# Patient Record
Sex: Female | Born: 1935 | Race: White | Hispanic: No | Marital: Married | Smoking: Former smoker
Health system: Southern US, Community
[De-identification: ages and names within clinical notes are randomized; demographics above are authoritative.]

## PROBLEM LIST (undated history)

## (undated) DIAGNOSIS — K7460 Unspecified cirrhosis of liver: Secondary | ICD-10-CM

## (undated) DIAGNOSIS — I10 Essential (primary) hypertension: Secondary | ICD-10-CM

## (undated) DIAGNOSIS — G459 Transient cerebral ischemic attack, unspecified: Secondary | ICD-10-CM

## (undated) DIAGNOSIS — E119 Type 2 diabetes mellitus without complications: Secondary | ICD-10-CM

## (undated) DIAGNOSIS — K5792 Diverticulitis of intestine, part unspecified, without perforation or abscess without bleeding: Secondary | ICD-10-CM

## (undated) DIAGNOSIS — F0390 Unspecified dementia without behavioral disturbance: Secondary | ICD-10-CM

## (undated) HISTORY — PX: FEMUR SURGERY: SHX943

---

## 1997-12-13 ENCOUNTER — Other Ambulatory Visit: Admission: RE | Admit: 1997-12-13 | Discharge: 1997-12-13 | Payer: Self-pay | Admitting: Gynecology

## 1998-01-19 ENCOUNTER — Ambulatory Visit (HOSPITAL_COMMUNITY): Admission: RE | Admit: 1998-01-19 | Discharge: 1998-01-19 | Payer: Self-pay | Admitting: Gynecology

## 1999-12-01 ENCOUNTER — Emergency Department (HOSPITAL_COMMUNITY): Admission: EM | Admit: 1999-12-01 | Discharge: 1999-12-01 | Payer: Self-pay | Admitting: Emergency Medicine

## 1999-12-01 ENCOUNTER — Encounter: Payer: Self-pay | Admitting: Emergency Medicine

## 2016-07-14 ENCOUNTER — Emergency Department (HOSPITAL_COMMUNITY)
Admission: EM | Admit: 2016-07-14 | Discharge: 2016-07-15 | Disposition: A | Discharge status: Home / Self Care | Payer: Medicare HMO | Attending: Emergency Medicine | Admitting: Emergency Medicine

## 2016-07-14 ENCOUNTER — Encounter (HOSPITAL_COMMUNITY): Payer: Self-pay | Admitting: Emergency Medicine

## 2016-07-14 ENCOUNTER — Emergency Department (HOSPITAL_COMMUNITY): Payer: Medicare HMO

## 2016-07-14 DIAGNOSIS — E119 Type 2 diabetes mellitus without complications: Secondary | ICD-10-CM | POA: Diagnosis not present

## 2016-07-14 DIAGNOSIS — N939 Abnormal uterine and vaginal bleeding, unspecified: Secondary | ICD-10-CM | POA: Insufficient documentation

## 2016-07-14 DIAGNOSIS — Z87891 Personal history of nicotine dependence: Secondary | ICD-10-CM | POA: Diagnosis not present

## 2016-07-14 DIAGNOSIS — N390 Urinary tract infection, site not specified: Secondary | ICD-10-CM | POA: Diagnosis not present

## 2016-07-14 DIAGNOSIS — Z7982 Long term (current) use of aspirin: Secondary | ICD-10-CM | POA: Insufficient documentation

## 2016-07-14 DIAGNOSIS — Z7984 Long term (current) use of oral hypoglycemic drugs: Secondary | ICD-10-CM | POA: Diagnosis not present

## 2016-07-14 DIAGNOSIS — G309 Alzheimer's disease, unspecified: Secondary | ICD-10-CM | POA: Insufficient documentation

## 2016-07-14 DIAGNOSIS — I10 Essential (primary) hypertension: Secondary | ICD-10-CM | POA: Insufficient documentation

## 2016-07-14 DIAGNOSIS — R109 Unspecified abdominal pain: Secondary | ICD-10-CM | POA: Diagnosis present

## 2016-07-14 HISTORY — DX: Essential (primary) hypertension: I10

## 2016-07-14 HISTORY — DX: Unspecified cirrhosis of liver: K74.60

## 2016-07-14 HISTORY — DX: Transient cerebral ischemic attack, unspecified: G45.9

## 2016-07-14 HISTORY — DX: Diverticulitis of intestine, part unspecified, without perforation or abscess without bleeding: K57.92

## 2016-07-14 HISTORY — DX: Unspecified dementia, unspecified severity, without behavioral disturbance, psychotic disturbance, mood disturbance, and anxiety: F03.90

## 2016-07-14 HISTORY — DX: Type 2 diabetes mellitus without complications: E11.9

## 2016-07-14 LAB — COMPREHENSIVE METABOLIC PANEL
ALT: 14 U/L (ref 14–54)
AST: 21 U/L (ref 15–41)
Albumin: 3.5 g/dL (ref 3.5–5.0)
Alkaline Phosphatase: 111 U/L (ref 38–126)
Anion gap: 8 (ref 5–15)
BUN: 14 mg/dL (ref 6–20)
CO2: 23 mmol/L (ref 22–32)
Calcium: 10.2 mg/dL (ref 8.9–10.3)
Chloride: 102 mmol/L (ref 101–111)
Creatinine, Ser: 0.8 mg/dL (ref 0.44–1.00)
GFR calc Af Amer: >60 mL/min (ref >60)
GFR calc non Af Amer: >60 mL/min (ref >60)
Glucose, Bld: 130 mg/dL — ABNORMAL HIGH (ref 65–99)
Potassium: 4 mmol/L (ref 3.5–5.1)
Sodium: 133 mmol/L — ABNORMAL LOW (ref 135–145)
Total Bilirubin: 0.6 mg/dL (ref 0.3–1.2)
Total Protein: 7.1 g/dL (ref 6.5–8.1)

## 2016-07-14 LAB — CBC WITH DIFFERENTIAL/PLATELET
Basophils Absolute: 0 10*3/uL (ref 0.0–0.1)
Basophils Relative: 0 %
Eosinophils Absolute: 0.1 10*3/uL (ref 0.0–0.7)
Eosinophils Relative: 2 %
HCT: 37.1 % (ref 36.0–46.0)
Hemoglobin: 12.2 g/dL (ref 12.0–15.0)
Lymphocytes Relative: 28 %
Lymphs Abs: 1.9 10*3/uL (ref 0.7–4.0)
MCH: 33.2 pg (ref 26.0–34.0)
MCHC: 32.9 g/dL (ref 30.0–36.0)
MCV: 101.1 fL — ABNORMAL HIGH (ref 78.0–100.0)
Monocytes Absolute: 0.7 10*3/uL (ref 0.1–1.0)
Monocytes Relative: 9 %
Neutro Abs: 4.3 10*3/uL (ref 1.7–7.7)
Neutrophils Relative %: 61 %
Platelets: 97 10*3/uL — ABNORMAL LOW (ref 150–400)
RBC: 3.67 MIL/uL — ABNORMAL LOW (ref 3.87–5.11)
RDW: 13.3 % (ref 11.5–15.5)
WBC: 7.1 10*3/uL (ref 4.0–10.5)

## 2016-07-14 LAB — URINALYSIS, ROUTINE W REFLEX MICROSCOPIC
BILIRUBIN URINE: NEGATIVE
Glucose, UA: NEGATIVE mg/dL
HGB URINE DIPSTICK: NEGATIVE
KETONES UR: NEGATIVE mg/dL
NITRITE: POSITIVE — AB
PROTEIN: NEGATIVE mg/dL
Specific Gravity, Urine: 1.018 (ref 1.005–1.030)
pH: 5 (ref 5.0–8.0)

## 2016-07-14 LAB — PROTIME-INR
INR: 1.02
Prothrombin Time: 13.4 seconds (ref 11.4–15.2)

## 2016-07-14 NOTE — ED Notes (Signed)
Pt with vaginal bleeding small amount and has passed a clot per pt's daughter (pt has dementia).

## 2016-07-14 NOTE — ED Notes (Signed)
Unable to obtain blood when IV was started. Lab called and advised they will need to redraw blood work.

## 2016-07-14 NOTE — ED Triage Notes (Addendum)
Patient c/o vaginal bleeding that started today. Denies any pain. Patient states bright red blood with clots. Patient has had filled x1 depends. Denies any bleeding at this time. Patient does take plavix and aspirin.

## 2016-07-14 NOTE — ED Notes (Signed)
Notified by lab that blood work hemolyzed and needs to be drawn by nurse when obtaining IV.

## 2016-07-14 NOTE — ED Provider Notes (Signed)
[facility] Provider Note   CSN: [id number] Arrival date & time: 07/14/16  1536     History   Chief Complaint Chief Complaint  Patient presents with  . Vaginal Bleeding    HPI Anita Walters is a 83 y.o. female.  Patient is an 83 year old female who presents to the emergency department with a complaint of vaginal bleeding.  The patient suffers from Alzheimer's. The patient's daughter is assisting with the history.  The patient and the patient's daughter states that the vaginal bleeding started today. The patient noted some mild bright red blood in her underwear. She later noted blood and clots in her underwear. It is of note that the patient takes Plavix and aspirin. His been no injury or trauma reported. The patient has not had fever or chills to be reported. The patient's daughter states that the patient goes to the bathroom quite frequently, but she is unsure if this is related to the patient's dementia, or even her diabetes. No blood or other symptoms on have been noted prior to today's episode. His been no report of falls or loss of consciousness. The patient denies any unusual weakness.   The history is provided by the patient and a relative.  Vaginal Bleeding  Primary symptoms include dysuria, and vaginal bleeding. Associated symptoms include frequency. Pertinent negatives include no abdominal pain, no diarrhea, no nausea and no vomiting.    Past Medical History:  Diagnosis Date  . Dementia   . Diabetes mellitus without complication (HCC)   . Diverticulitis   . Hypertension   . Liver cirrhosis (HCC)   . TIA (transient ischemic attack)     There are no active problems to display for this patient.   Past Surgical History:  Procedure Laterality Date  . FEMUR SURGERY Left     OB History    Gravida Para Term Preterm AB Living   6 6 5 1   6    SAB TAB Ectopic Multiple Live Births                   Home Medications    Prior to Admission medications     Medication Sig Start Date End Date Taking? Authorizing Provider  aspirin EC 81 MG tablet Take 81 mg by mouth daily.   Yes Historical Provider, MD  carvedilol (COREG) 6.25 MG tablet Take 6.25 mg by mouth 2 (two) times daily. 07/06/16  Yes Historical Provider, MD  clopidogrel (PLAVIX) 75 MG tablet Take 75 mg by mouth daily. 05/10/16  Yes Historical Provider, MD  escitalopram (LEXAPRO) 10 MG tablet Take 10 mg by mouth daily. 07/01/16  Yes Historical Provider, MD  ferrous sulfate 325 (65 FE) MG tablet Take 325 mg by mouth daily with breakfast.   Yes Historical Provider, MD  levothyroxine (SYNTHROID, LEVOTHROID) 75 MCG tablet Take 75 mcg by mouth daily. 07/01/16  Yes Historical Provider, MD  LORazepam (ATIVAN) 0.5 MG tablet Take 0.5 mg by mouth 2 (two) times daily as needed for anxiety or sleep.  06/05/16  Yes Historical Provider, MD  metFORMIN (GLUCOPHAGE) 500 MG tablet Take 500 mg by mouth 2 (two) times daily. 07/06/16  Yes Historical Provider, MD  oxyCODONE (OXY IR/ROXICODONE) 5 MG immediate release tablet Take 5 mg by mouth 3 (three) times daily as needed for moderate pain or severe pain.  06/20/16  Yes Historical Provider, MD  spironolactone (ALDACTONE) 25 MG tablet Take 25 mg by mouth daily. 07/01/16  Yes Historical Provider, MD  traZODone (DESYREL) 50 MG  tablet Take 50 mg by mouth at bedtime.  06/05/16  Yes Historical Provider, MD    Family History Family History  Problem Relation Age of Onset  . Heart failure Mother   . Diabetes Other     Social History Social History  Substance Use Topics  . Smoking status: Former Smoker    Years: 20.00    Types: Cigarettes    Quit date: 05/27/1979  . Smokeless tobacco: Never Used  . Alcohol use No     Allergies   Morphine and related   Review of Systems Review of Systems  Constitutional: Positive for appetite change. Negative for activity change, chills and fever.  HENT: Negative for congestion, sore throat and trouble swallowing.   Eyes: Negative.    Respiratory: Negative for cough, choking, shortness of breath and wheezing.   Cardiovascular: Negative for chest pain and leg swelling.  Gastrointestinal: Negative for abdominal pain, anal bleeding, blood in stool, diarrhea, nausea, rectal pain and vomiting.  Genitourinary: Positive for dysuria, frequency and vaginal bleeding.  Musculoskeletal: Positive for arthralgias and back pain.  Skin: Negative for rash and wound.     Physical Exam Updated Vital Signs BP (!) 148/52 (BP Location: Left Arm)   Pulse (!) 58   Temp 98.2 F (36.8 C) (Oral)   Resp 20   Ht 5\' 7"  (1.702 m)   Wt 94.3 kg   SpO2 99%   BMI 32.58 kg/m   Physical Exam  Constitutional: She is oriented to person, place, and time. She appears well-developed and well-nourished.  Non-toxic appearance. No distress.  Daughter reports pt has bouts of dementia, and all responses may not be accurate.  HENT:  Head: Normocephalic.  Right Ear: Tympanic membrane and external ear normal.  Left Ear: Tympanic membrane and external ear normal.  Eyes: EOM and lids are normal. Pupils are equal, round, and reactive to light. No scleral icterus.  Neck: Normal range of motion. Neck supple. Carotid bruit is not present.  Cardiovascular: Normal rate, regular rhythm, normal heart sounds, intact distal pulses and normal pulses.  Exam reveals no gallop and no friction rub.   Pulmonary/Chest: Effort normal and breath sounds normal. No respiratory distress. She has no wheezes.  Abdominal: Soft. Bowel sounds are normal. There is no tenderness. There is no guarding.  Mild to moderate tenderness at the suprapubic area and the left lower quadrant.  Musculoskeletal: Normal range of motion.  Lymphadenopathy:       Head (right side): No submandibular adenopathy present.       Head (left side): No submandibular adenopathy present.    She has no cervical adenopathy.  Neurological: She is alert and oriented to person, place, and time. She has normal  strength. No cranial nerve deficit or sensory deficit.  Skin: Skin is warm and dry.  Psychiatric: She has a normal mood and affect. Her speech is normal.  Nursing note and vitals reviewed.    ED Treatments / Results  Labs (all labs ordered are listed, but only abnormal results are displayed) Labs Reviewed  CBC WITH DIFFERENTIAL/PLATELET  CBC WITH DIFFERENTIAL/PLATELET  COMPREHENSIVE METABOLIC PANEL  PROTIME-INR    EKG  EKG Interpretation None       Radiology No results found.  Procedures Procedures (including critical care time)  Medications Ordered in ED Medications - No data to display   Initial Impression / Assessment and Plan / ED Course  I have reviewed the triage vital signs and the nursing notes.  Pertinent labs & imaging  results that were available during my care of the patient were reviewed by me and considered in my medical decision making (see chart for details).     *I have reviewed nursing notes, vital signs, and all appropriate lab and imaging results for this patient.**  Final Clinical Impressions(s) / ED Diagnoses MDM Patient reports vaginal bleeding that started today. At one point she states she saw bright red blood and clots present. She's not had this problem in the past. There's been no recent operations or procedures. There's been no recent injury or trauma to the area. Initial vital signs were well within normal limits, with a pulse rate of 67 and blood pressure 122/55.  The initial blood draw hemolyzed. Lab requested a second blood draw. Attempts were made to do this when the IV was started, but this was unsuccessful. The lab has been called back to the room to draw the blood. I discussed with the family the delay in obtaining information because of the difficulty with lab draw.   Recheck. Patient's heart rate slightly lower than on admission at 58 bpm, no other changes appreciated. No further bleeding while being in the emergency  department.  Urinalysis shows a hazy yellow specimen positive for nitrates and leukocyte esterase. There are few bacteria also present. Urine will be sent to the lab for culture.  Complete blood count shows the white blood cells to be normal at 7100. Hemoglobin and hematocrit of both middle. Platelets are low at 97,000. Competence of metabolic panel shows the sodium to be slightly low at 133, glucose is slightly elevated at 1:30. Remainder of the competence of metabolic panel is within normal limits. CT scan of the abdomen shows extensive sigmoid diverticulosis with muscular atrophy being present. There is mild sigmoid stranding, which is felt to be related to a chronic inflammation and scarring. There is also noted a small loculated air-containing collection within the pelvis adjacent to the sigmoid colon. It is felt this is probably related to residual air from a prior abscess area. Is no evidence of bowel obstruction. The appendix is normal. There is some cirrhosis present with portal venous hypertension present. There is also noted a 3.2 cm infrarenal abdominal aortic aneurysm that appears stable from the previous CT scan.  I discussed with the patient's daughter and the patient the findings from lab as well as from CT scanning. I suspect that this bleeding is related to urinary tract infection. However the possibility of it being related to the fistulous tract between the sigmoid colon and the posterior lower uterus is also a possibility. I explained to the family that I was reassured that during the entire emergency department visit the vital signs remained stable as well as the hemoglobin and hematocrit also normal. The patient will be treated for urinary tract infection. Culture will be sent to the lab. Given strict instructions to return immediately if any changes, problems, or concerns. The patient's daughter is in agreement with this plan.    Final diagnoses:  None    New Prescriptions New  Prescriptions   No medications on file     Ivery Quale, PA-C 07/17/16 1913    Lavera Guise, MD 07/18/16 [phone]

## 2016-07-15 MED ORDER — ONDANSETRON HCL 4 MG PO TABS
4.0000 mg | ORAL_TABLET | Freq: Once | ORAL | Status: AC
  Administered 2016-07-15: 4 mg via ORAL
  Filled 2016-07-15: qty 1

## 2016-07-15 MED ORDER — CEPHALEXIN 500 MG PO CAPS
500.0000 mg | ORAL_CAPSULE | Freq: Once | ORAL | Status: AC
  Administered 2016-07-15: 500 mg via ORAL
  Filled 2016-07-15: qty 1

## 2016-07-15 MED ORDER — CEPHALEXIN 500 MG PO CAPS: 500.0000 mg | ORAL_CAPSULE | Freq: Four times a day (QID) | ORAL | 0 refills | Status: DC

## 2016-07-15 NOTE — ED Notes (Signed)
Pt alert & oriented x4. Patient given discharge instructions, paperwork & prescription(s). Patient verbalized understanding. Pt left department in wheelchair escorted by staff. Pt left w/ no further questions. 

## 2016-07-15 NOTE — Discharge Instructions (Signed)
Your vital signs are within normal limits. Your lab work is negative for acute findings on. The CT scan does not show any acute problem. When compared to previous CT scans, we see where you have had diverticulitis, and continue to have a condition called diverticulosis on. We also see where that you have an abscess in the past. There is a question as to whether or not you have a small fistula from your sigmoid colon to your uterus area. There is also question as to whether or not the bleeding you noted is vaginal bleeding after menopause. You also have a urinary tract infection which could also be contributing to this bleeding. Please use Keflex with breakfast, lunch, dinner, and at bedtime. Please see Dr. Margo AyeHall in 5-7 days for recheck of your urine and recheck of your bleeding. Please return to the emergency department if the bleeding continues or becomes more forceful.

## 2016-07-17 LAB — URINE CULTURE

## 2016-07-18 ENCOUNTER — Telehealth (HOSPITAL_BASED_OUTPATIENT_CLINIC_OR_DEPARTMENT_OTHER): Payer: Self-pay

## 2016-07-18 NOTE — Telephone Encounter (Signed)
Post ED Visit - Positive Culture Follow-up  Culture report reviewed by antimicrobial stewardship pharmacist:  [x]  [name], Pharm.D. []  [name], Pharm.D., BCPS []  [name], Pharm.D. []  [name], Pharm.D., BCPS []  [name], [address]Pharm[address]D., BCPS, AAHIVP []  [name], Pharm.D., BCPS, AAHIVP []  [name], [address]Pharm[address] []  [name], [place]D.  Positive urine culture, >/= 100,000 colonies -> E Coli Treated with Cephalexin, organism sensitive to the same and no further patient follow-up is required at this time.   Anita RightClark, Anita Walters 07/18/2016, 11:36 AM

## 2016-09-15 ENCOUNTER — Encounter: Payer: Self-pay | Admitting: Neurology

## 2016-11-14 ENCOUNTER — Ambulatory Visit: Payer: Medicare HMO | Admitting: Neurology

## 2017-06-25 ENCOUNTER — Ambulatory Visit: Payer: Medicare HMO | Admitting: Cardiology

## 2017-07-10 ENCOUNTER — Ambulatory Visit: Payer: Medicare HMO | Admitting: Cardiovascular Disease

## 2017-07-17 ENCOUNTER — Encounter: Payer: Self-pay | Admitting: *Deleted

## 2017-07-17 ENCOUNTER — Ambulatory Visit: Payer: Medicare HMO | Admitting: Cardiology

## 2017-07-17 ENCOUNTER — Encounter: Payer: Self-pay | Admitting: Cardiology

## 2017-07-17 VITALS — BP 118/74 | HR 76 | Ht 66.0 in | Wt 218.0 lb

## 2017-07-17 DIAGNOSIS — I10 Essential (primary) hypertension: Secondary | ICD-10-CM | POA: Insufficient documentation

## 2017-07-17 DIAGNOSIS — R0789 Other chest pain: Secondary | ICD-10-CM | POA: Diagnosis not present

## 2017-07-17 NOTE — Progress Notes (Signed)
Clinical Summary Ms. Anita Walters is a 84 y.o.female seen as new consult, referred by Dr Margo AyeHall for chest pain.   1. Chest pain - episode 2 months ago. Patient felt tightness, SOB. Fairly frequent. Symptoms would last about 10 minutes. - wheelchair bound. Significant dementia. Collecting history is difficult - she denies any chest pain symptoms over the last few weeks     Past Medical History:  Diagnosis Date  . Dementia   . Diabetes mellitus without complication (HCC)   . Diverticulitis   . Hypertension   . Liver cirrhosis (HCC)   . TIA (transient ischemic attack)      Allergies  Allergen Reactions  . Morphine And Related Anaphylaxis     Current Outpatient Medications  Medication Sig Dispense Refill  . aspirin EC 81 MG tablet Take 81 mg by mouth daily.    . carvedilol (COREG) 6.25 MG tablet Take 6.25 mg by mouth 2 (two) times daily.    . cephALEXin (KEFLEX) 500 MG capsule Take 1 capsule (500 mg total) by mouth 4 (four) times daily. 20 capsule 0  . clopidogrel (PLAVIX) 75 MG tablet Take 75 mg by mouth daily.    Marland Kitchen. escitalopram (LEXAPRO) 10 MG tablet Take 10 mg by mouth daily.    . ferrous sulfate 325 (65 FE) MG tablet Take 325 mg by mouth daily with breakfast.    . levothyroxine (SYNTHROID, LEVOTHROID) 75 MCG tablet Take 75 mcg by mouth daily.    Marland Kitchen. LORazepam (ATIVAN) 0.5 MG tablet Take 0.5 mg by mouth 2 (two) times daily as needed for anxiety or sleep.     . metFORMIN (GLUCOPHAGE) 500 MG tablet Take 500 mg by mouth 2 (two) times daily.    Marland Kitchen. oxyCODONE (OXY IR/ROXICODONE) 5 MG immediate release tablet Take 5 mg by mouth 3 (three) times daily as needed for moderate pain or severe pain.     Marland Kitchen. spironolactone (ALDACTONE) 25 MG tablet Take 25 mg by mouth daily.    . traZODone (DESYREL) 50 MG tablet Take 50 mg by mouth at bedtime.      No current facility-administered medications for this visit.      Past Surgical History:  Procedure Laterality Date  . FEMUR SURGERY Left       Allergies  Allergen Reactions  . Morphine And Related Anaphylaxis      Family History  Problem Relation Age of Onset  . Heart failure Mother   . Diabetes Other      Social History Ms. Early reports that she quit smoking about 38 years ago. Her smoking use included cigarettes. She quit after 20.00 years of use. she has never used smokeless tobacco. Ms. Anita Walters reports that she does not drink alcohol.   Review of Systems CONSTITUTIONAL: No weight loss, fever, chills, weakness or fatigue.  HEENT: Eyes: No visual loss, blurred vision, double vision or yellow sclerae.No hearing loss, sneezing, congestion, runny nose or sore throat.  SKIN: No rash or itching.  CARDIOVASCULAR: per hpi RESPIRATORY: No shortness of breath, cough or sputum.  GASTROINTESTINAL: No anorexia, nausea, vomiting or diarrhea. No abdominal pain or blood.  GENITOURINARY: No burning on urination, no polyuria NEUROLOGICAL: No headache, dizziness, syncope, paralysis, ataxia, numbness or tingling in the extremities. No change in bowel or bladder control.  MUSCULOSKELETAL: No muscle, back pain, joint pain or stiffness.  LYMPHATICS: No enlarged nodes. No history of splenectomy.  PSYCHIATRIC: No history of depression or anxiety.  ENDOCRINOLOGIC: No reports of sweating, cold or heat  intolerance. No polyuria or polydipsia.  Marland Kitchen   Physical Examination Vitals:   07/17/17 0853 07/17/17 0858  BP: 118/80 118/74  Pulse: 79 76  SpO2: 94% 96%   Vitals:   07/17/17 0853  Weight: 218 lb (98.9 kg)  Height: 5\' 6"  (1.676 m)    Gen: resting comfortably, no acute distress HEENT: no scleral icterus, pupils equal round and reactive, no palptable cervical adenopathy,  CV: RRR, no m/r/g, no jvd Resp: Clear to auscultation bilaterally GI: abdomen is soft, non-tender, non-distended, normal bowel sounds, no hepatosplenomegaly MSK: extremities are warm, no edema.  Skin: warm, no rash Neuro:  no focal deficits Psych:  appropriate affect     Assessment and Plan  1. Chest pain - history is difficult to gather, patient with significant dementia - denies any symptoms over the last few weeks - discussed options as far as ischemic testing, and if stress testing was abnormal typically next step would be cath - due to her advanced dementia, poor functional capacity/wheel chair bound family is not in favor of ischemic testing at this time, I agree. Would continue to monitor, if recurrence could try course of antianginal therapy and follow symptoms.   F/u 6 months     Antoine Poche, M.D

## 2017-07-17 NOTE — Patient Instructions (Signed)
Your physician wants you to follow-up in:6 months  with Dr.Branch You will receive a reminder letter in the mail two months in advance. If you don't receive a letter, please call our office to schedule the follow-up appointment.   Your physician recommends that you continue on your current medications as directed. Please refer to the Current Medication list given to you today.    If you need a refill on your cardiac medications before your next appointment, please call your pharmacy.      No lab work or tests ordered today.       Thank you for choosing [facility] !         

## 2017-07-21 ENCOUNTER — Encounter: Payer: Self-pay | Admitting: Cardiology

## 2017-09-03 ENCOUNTER — Encounter: Payer: Self-pay | Admitting: Adult Health

## 2017-09-15 ENCOUNTER — Encounter: Payer: Self-pay | Admitting: Adult Health

## 2017-09-15 ENCOUNTER — Ambulatory Visit (INDEPENDENT_AMBULATORY_CARE_PROVIDER_SITE_OTHER): Payer: Medicare HMO | Admitting: Adult Health

## 2017-09-15 VITALS — BP 130/72 | HR 62

## 2017-09-15 DIAGNOSIS — N898 Other specified noninflammatory disorders of vagina: Secondary | ICD-10-CM | POA: Diagnosis not present

## 2017-09-15 DIAGNOSIS — N760 Acute vaginitis: Secondary | ICD-10-CM

## 2017-09-15 DIAGNOSIS — N950 Postmenopausal bleeding: Secondary | ICD-10-CM | POA: Diagnosis not present

## 2017-09-15 LAB — POCT WET PREP (WET MOUNT): WBC WET PREP: POSITIVE

## 2017-09-15 MED ORDER — METRONIDAZOLE 0.75 % VA GEL: 1.0000 | Freq: Two times a day (BID) | VAGINAL | 1 refills | Status: AC

## 2017-09-15 NOTE — Progress Notes (Signed)
Subjective:     Patient ID:  Anita Walters, female   DOB: 10/23/1933, 84 y.o.   MRN: [id number]  HPI Anita Walters is a 84 year old white female, married, PM, in complaining of vaginal spotting for a while, no pain.[facility]sp [facility], has diabetes and dementia, her husband and daughter are with her, but she arrived by rescue squad.  PCP is Dr Eden EmmsAriza.   Review of Systems Vaginal spotting for a while No pain Reviewed past medical,surgical, social and family history. Reviewed medications and allergies.     Objective:   Physical Exam BP 130/72 (BP Location: Left Arm, Patient Position: Sitting, Cuff Size: Normal)   Pulse 62  Skin warm and dry. Neck: mid line trachea, normal thyroid, good ROM, no lymphadenopathy noted. Lungs: clear to ausculation bilaterally. Cardiovascular: regular rate and rhythm. Pelvic: external genitalia is normal in appearance no lesions, vagina:copius tannish yellow vaginal discharge, slight odor,urethra has no lesions or masses noted, cervix is poorly visualized due pt inability to relax legs, uterus: normal size, shape and contour,mildly tender, no masses felt, adnexa: no masses,mild LLQ tenderness noted. Bladder is non tender and no masses felt. Wet prep: +++WBCs. PHQ 9 score 16 is on meds.      Assessment:     1. PMB (postmenopausal bleeding)   2. Vaginal discharge   3. Acute vaginitis       Plan:     Meds ordered this encounter  Medications  . metroNIDAZOLE (METROGEL) 0.75 % vaginal gel    Sig: Place 1 Applicatorful vaginally 2 (two) times daily.    Dispense:  70 g    Refill:  1    Order Specific Question:   Supervising Provider    Answer:   Duane LopeEURE, LUTHER H [[id number]]  Return in about a week for GYN US and see me afterwards Review handout on PMB

## 2017-09-15 NOTE — Patient Instructions (Signed)
Use metrogel bid in vagina Postmenopausal Bleeding Postmenopausal bleeding is any bleeding after menopause. Menopause is when a woman's period stops. Any type of bleeding after menopause is concerning. It should be checked by your doctor. Any treatment will depend on the cause. Follow these instructions at home: Watch your condition for any changes.  Avoid the use of tampons and douches as told by your doctor.  Change your pads often.  Get regular pelvic exams and Pap tests.  Keep all appointments for tests as told by your doctor.  Contact a doctor if:  Your bleeding lasts for more than 1 week.  You have belly (abdominal) pain.  You have bleeding after sex (intercourse). Get help right away if:  You have a fever, chills, a headache, dizziness, muscle aches, and bleeding.  You have strong pain with bleeding.  You have clumps of blood (blood clots) coming from your vagina.  You have bleeding and need more than 1 pad an hour.  You feel like you are going to pass out (faint). This information is not intended to replace advice given to you by your health care provider. Make sure you discuss any questions you have with your health care provider. Document Released: 02/19/2008 Document Revised: 10/18/2015 Document Reviewed: 12/09/2012 Elsevier Interactive Patient Education  2017 [employer]

## 2017-09-25 ENCOUNTER — Encounter: Payer: Self-pay | Admitting: Adult Health

## 2017-09-25 ENCOUNTER — Ambulatory Visit (INDEPENDENT_AMBULATORY_CARE_PROVIDER_SITE_OTHER): Payer: Medicare HMO

## 2017-09-25 ENCOUNTER — Ambulatory Visit (INDEPENDENT_AMBULATORY_CARE_PROVIDER_SITE_OTHER): Payer: Medicare HMO | Admitting: Adult Health

## 2017-09-25 VITALS — BP 140/90 | HR 92

## 2017-09-25 DIAGNOSIS — R9389 Abnormal findings on diagnostic imaging of other specified body structures: Secondary | ICD-10-CM | POA: Insufficient documentation

## 2017-09-25 DIAGNOSIS — N950 Postmenopausal bleeding: Secondary | ICD-10-CM | POA: Diagnosis not present

## 2017-09-25 NOTE — Progress Notes (Addendum)
PELVIC US TA/TV: heterogeneous anteverted uterus w/mult.myometrial calcification,homogeneous thickened endometrium 10.7 mm,unable to visualize ovaries,bilat adnexa's wnl,limited ultrasound,pt was unable to put legs in stirrups or empty bladder,she was scanned on stretcher.

## 2017-09-25 NOTE — Progress Notes (Signed)
  Subjective:     Patient ID:  Anita Walters, female   DOB: [birth year], 84 y.o.   MRN: [id number]  HPI Anita Walters is an 84 year old white female in for [place] to assess PMB.She resides at [facility] and is transported by ambulance.   Review of Systems PMB Recent discharge cleared per pt Reviewed past medical,surgical, social and family history. Reviewed medications and allergies.     Objective:   Physical Exam BP 140/90 (BP Location: Left Arm, Patient Position: Supine, Cuff Size: Large)   Pulse 92    US showed 10.7 mm endometrial strip and calcifications,was difficult exam.  Assessment:     1. Thickened endometrium       Plan:     Return in 2 weeks to see Dr Despina Hidden for endometrial biopsy,give extra time

## 2017-10-08 ENCOUNTER — Other Ambulatory Visit: Payer: Medicare HMO | Admitting: Obstetrics & Gynecology

## 2017-10-12 ENCOUNTER — Encounter (INDEPENDENT_AMBULATORY_CARE_PROVIDER_SITE_OTHER): Payer: Self-pay

## 2017-10-16 ENCOUNTER — Other Ambulatory Visit: Payer: Medicare HMO | Admitting: Obstetrics & Gynecology

## 2017-10-23 ENCOUNTER — Encounter: Payer: Self-pay | Admitting: Obstetrics & Gynecology

## 2017-10-23 ENCOUNTER — Ambulatory Visit (INDEPENDENT_AMBULATORY_CARE_PROVIDER_SITE_OTHER): Payer: Medicare HMO | Admitting: Obstetrics & Gynecology

## 2017-10-23 ENCOUNTER — Other Ambulatory Visit: Payer: Self-pay

## 2017-10-23 VITALS — BP 124/80 | HR 82 | Ht 66.0 in | Wt 220.0 lb

## 2017-10-23 DIAGNOSIS — N950 Postmenopausal bleeding: Secondary | ICD-10-CM

## 2017-10-23 DIAGNOSIS — R9389 Abnormal findings on diagnostic imaging of other specified body structures: Secondary | ICD-10-CM | POA: Diagnosis not present

## 2017-11-11 IMAGING — CT CT ABD-PELV W/O CM
2 of 4 series · 15 of 46 positions shown, 17 images · non-contrast
Comparison: CT of the abdomen pelvis dated 03/23/2016

CLINICAL DATA: 82-year-old female with vaginal bleeding and
abdominal pain.

EXAM:
CT ABDOMEN AND PELVIS WITHOUT CONTRAST
TECHNIQUE: Multidetector CT imaging of the abdomen and pelvis was performed
following the standard protocol without IV contrast.

[Series 2: axial st · axial · 0.88mm/px · z∈[+576,+1016]mm · 12 of 97 slices shown, 14 images]
[im 5/97  soft-tissue]
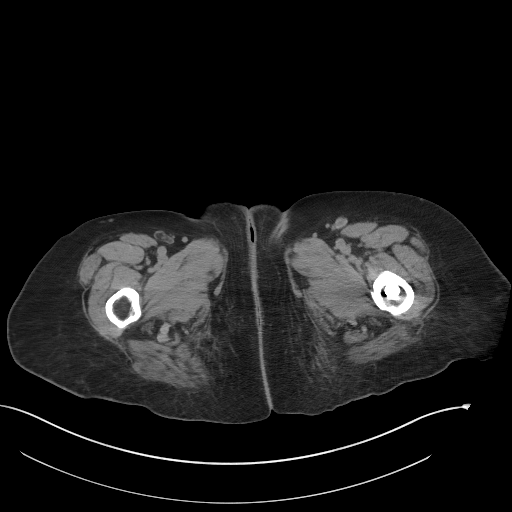
[im 5/97  bone]
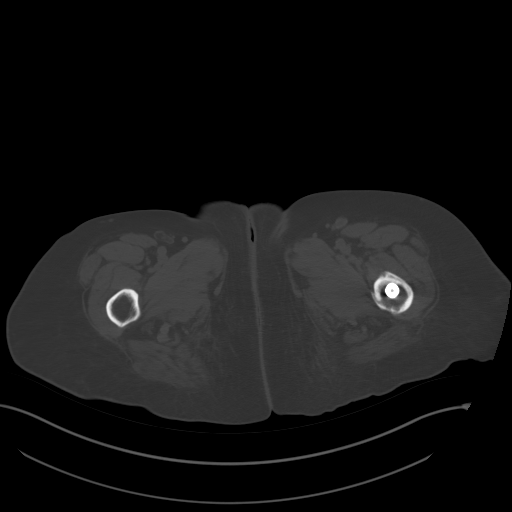
[im 13/97  soft-tissue]
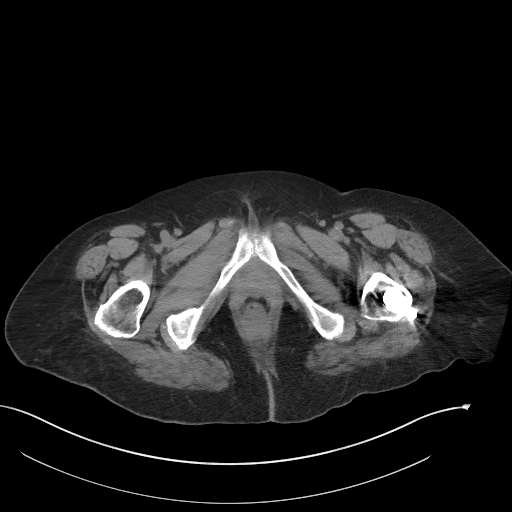
[im 21/97  soft-tissue]
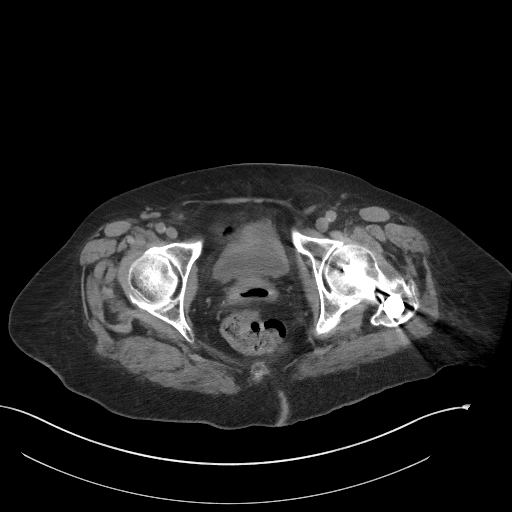
[im 29/97  soft-tissue]
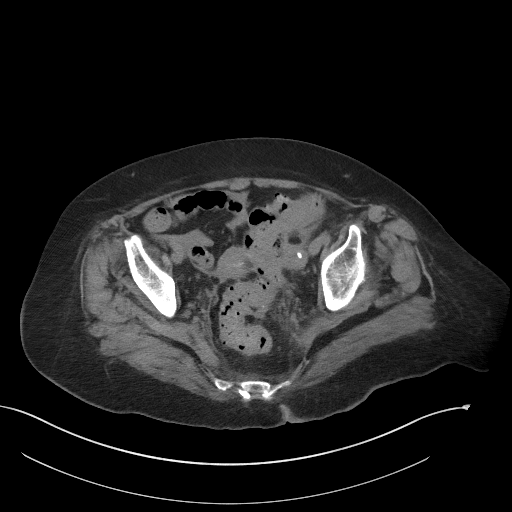
[im 37/97  soft-tissue]
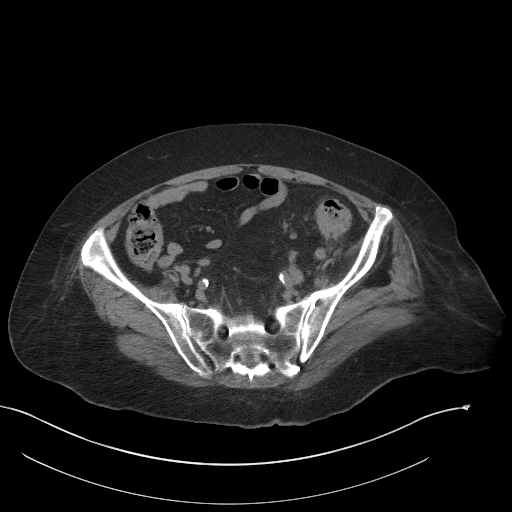
[im 45/97  soft-tissue]
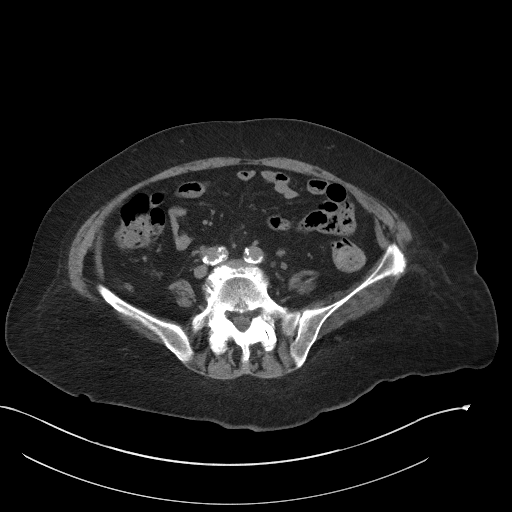
[im 53/97  soft-tissue]
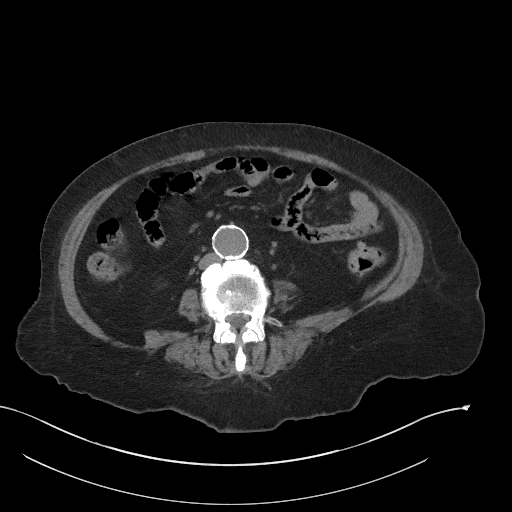
[im 61/97  soft-tissue]
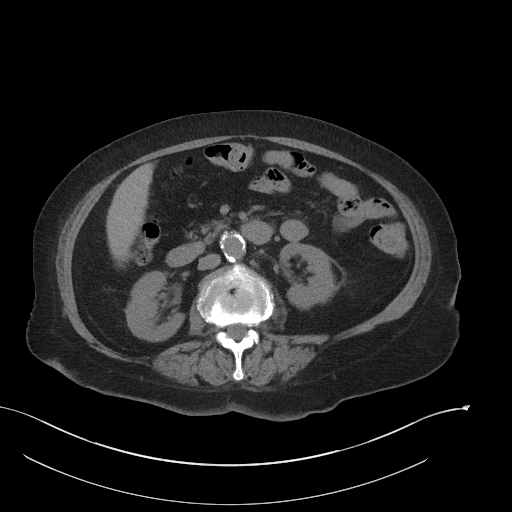
[im 69/97  soft-tissue]
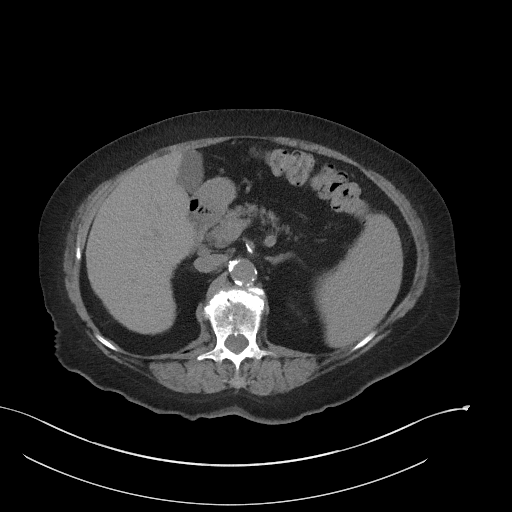
[im 69/97  bone]
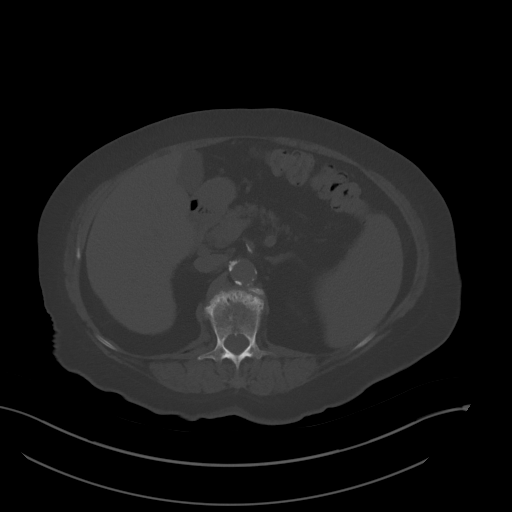
[im 77/97  soft-tissue]
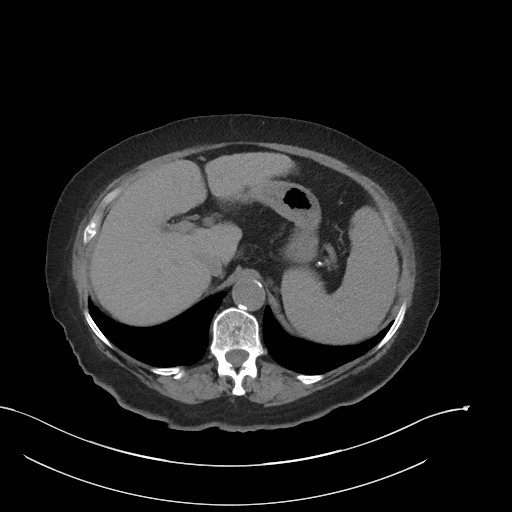
[im 85/97  soft-tissue]
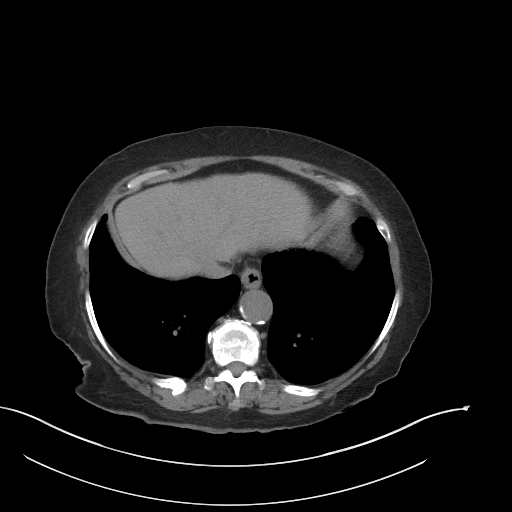
[im 93/97  soft-tissue]
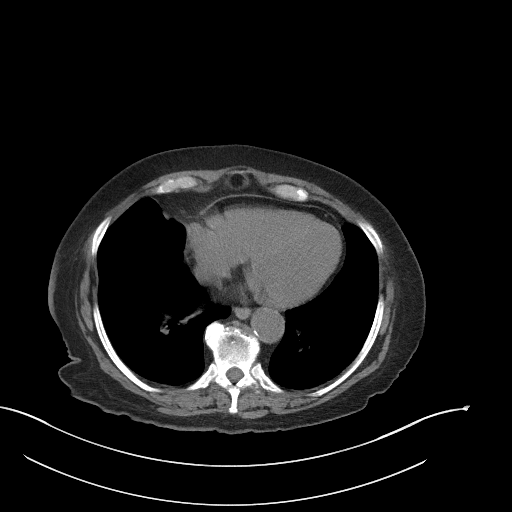

[Series 5: coronal st · coronal · 0.94mm/px · 3 of 95 slices shown]
[im 32/95  soft-tissue]
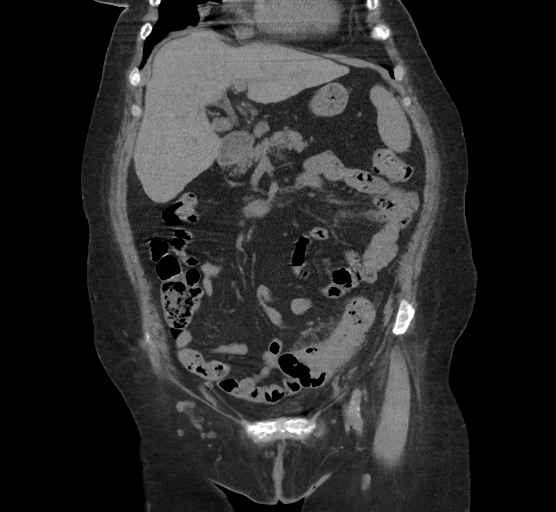
[im 42/95  soft-tissue]
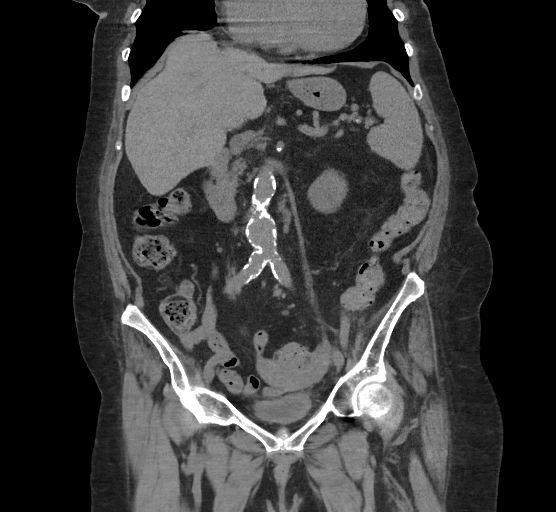
[im 53/95  soft-tissue]
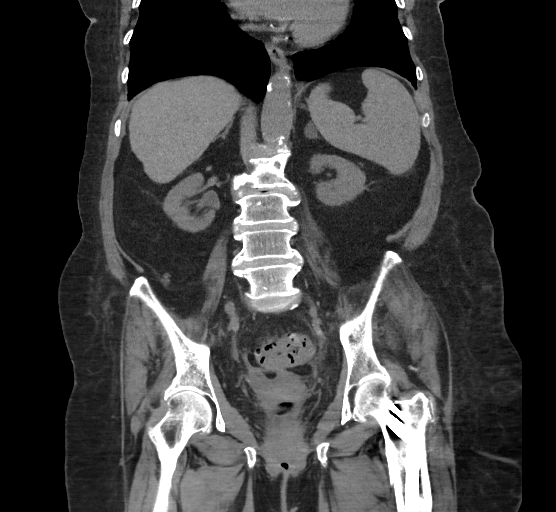

[15 of 46 positions shown; findings below may reference images not displayed]

FINDINGS: Evaluation of this exam is limited in the absence of intravenous
contrast.

Lower chest: The visualized lung bases are clear. There is mild
cardiomegaly stable from prior CT.

No intra-abdominal free air. Trace free fluid may be present within
the pelvis.

Hepatobiliary: Morphologic changes of cirrhosis. No intrahepatic
biliary ductal dilatation. The gallbladder is unremarkable.

Pancreas: Unremarkable. No pancreatic ductal dilatation or
surrounding inflammatory changes.

Spleen: Stable mild splenomegaly.

Adrenals/Urinary Tract: There is a stable 1.7 x 2.2 cm left adrenal
adenoma. The right adrenal gland appears unremarkable. There is mild
bilateral renal parenchymal atrophy. There is no hydronephrosis or
nephrolithiasis on either side. The visualized ureters appear
unremarkable. The urinary bladder predominantly collapsed. There is
apparent diffuse thickening of the bladder wall which may be partly
related to underdistention. Cystitis is not excluded. Correlation
with urinalysis recommended.

Stomach/Bowel: There is sigmoid diverticulosis with muscular
hypertrophy. Mild perisigmoid stranding likely chronic and related
to prior inflammation and scarring. Mild acute diverticulitis is
less likely but not excluded. There is a 2.0 x 2.7 cm loculated air
containing collection adjacent to the sigmoid colon and inferior to
the lower uterus. There is loss of fat plane between this air
containing collection, sigmoid colon, and posterior uterus
consistent with adhesions. This loculated air containing collection
may represent residual air from previously seen abscess. A fistulous
tract extending from the sigmoid colon to the uterus or upper vagina
is not excluded. There also abutment of the sigmoid colon to the
uterine fundus.

There is moderate amount of stool throughout the colon. There is no
evidence of bowel obstruction. Normal appendix.

Vascular/Lymphatic: There is advanced aortoiliac atherosclerotic
disease. There is a 3.2 cm fusiform infrarenal abdominal aortic
aneurysm similar to prior CT. Evaluation of the vasculature is
limited on this noncontrast study. Follow-up as previously
recommended. The IVC appears unremarkable. No portal venous gas
identified. There is no adenopathy.

Reproductive: The uterus and ovaries are grossly unremarkable. There
is loss of fat plane within the sigmoid colon and uterine fundus and
the left ovary compatible with adhesions. There is air in the upper
vagina.

Other: Small fat containing umbilical hernia.

Musculoskeletal: There is osteopenia with multilevel degenerative
changes of the spine. Multiple old right posterior rib fractures
with nonunion. Partially visualized left femoral intramedullary rod
and transcervical dynamic screws through an old new left femoral
neck fracture. No acute fracture identified.
IMPRESSION: 1. Extensive sigmoid diverticulosis with muscular hypertrophy. Mild
perisigmoid stranding, likely related to chronic inflammation and
scarring. Mild acute diverticulitis is less likely.
2. Small loculated air containing collection within the pelvis
adjacent to the sigmoid colon and posterior to the uterus in the
area of previously seen abscess. This likely represents residual air
from prior abscess. There is however loss of fat plane between the
sigmoid colon and posterior lower uterus and therefore a fistulous
tract is not excluded.
3. No evidence of bowel obstruction.  Normal appendix.
4. Cirrhosis with evidence of portal venous hypertension and mild
splenomegaly.
5. A 3.2 cm fusiform infrarenal abdominal aortic aneurysm, stable
from prior CT. Follow-up as recommended on the prior CT.
6. Aortoiliac atherosclerotic disease.
7. Stable left adrenal adenoma.

## 2018-01-16 ENCOUNTER — Encounter: Payer: Self-pay | Admitting: Obstetrics & Gynecology

## 2018-01-16 NOTE — Progress Notes (Signed)
Chief Complaint  Patient presents with  . endometrial biopsy      84 y.o. [id number] No LMP recorded. Patient is postmenopausal. The current method of family planning is post menopausal status.  Outpatient Encounter Medications as of 10/23/2017  Medication Sig Note  . carvedilol (COREG) 25 MG tablet Take 25 mg by mouth 2 (two) times daily.    . clopidogrel (PLAVIX) 75 MG tablet Take 75 mg by mouth daily.   . diclofenac sodium (VOLTAREN) 1 % GEL Apply 2 g topically 2 (two) times daily.   Marland Kitchen escitalopram (LEXAPRO) 10 MG tablet Take 20 mg by mouth daily.    . insulin aspart (NOVOLOG) 100 UNIT/ML injection Inject into the skin as directed.   Marland Kitchen levothyroxine (SYNTHROID, LEVOTHROID) 75 MCG tablet Take 75 mcg by mouth daily.   Marland Kitchen LORazepam (ATIVAN) 0.5 MG tablet Take 0.5 mg by mouth 2 (two) times daily.    . Menthol, Topical Analgesic, (BIOFREEZE) 4 % GEL Apply 1 application topically 3 (three) times daily.   . metFORMIN (GLUCOPHAGE) 500 MG tablet Take 500 mg by mouth 2 (two) times daily.   . metroNIDAZOLE (METROGEL) 0.75 % vaginal gel Place 1 Applicatorful vaginally 2 (two) times daily.   . mirabegron ER (MYRBETRIQ) 25 MG TB24 tablet Take 25 mg by mouth daily.   . nitroGLYCERIN (NITROSTAT) 0.4 MG SL tablet Place 0.4 mg under the tongue every 5 (five) minutes as needed for chest pain.   Marland Kitchen oxyCODONE (OXY IR/ROXICODONE) 5 MG immediate release tablet Take 5 mg by mouth every 4 (four) hours.  07/14/2016: Mostly takes at bedtime  . traZODone (DESYREL) 50 MG tablet Take 75 mg by mouth at bedtime.    . trolamine salicylate (ASPERCREME) 10 % cream Apply 1 application topically 2 (two) times daily as needed for muscle pain.   . Vitamin D, Ergocalciferol, (DRISDOL) 50000 units CAPS capsule Take 50,000 Units by mouth every 7 (seven) days.   . [DISCONTINUED] acetaminophen (TYLENOL) 325 MG tablet Take 650 mg by mouth 3 (three) times daily as needed.   . [DISCONTINUED] LORazepam (ATIVAN) 1 MG tablet Take 1  mg by mouth at bedtime.   . [DISCONTINUED] pantoprazole (PROTONIX) 20 MG tablet Take 20 mg by mouth daily.    No facility-administered encounter medications on file as of 10/23/2017.     Subjective Anita Walters is seen due to thickened endometrial stripe 10.7 mm and PMB Brought in for biopsy but due to physical limitations and postioning/anatomy was unable to do the procedure Past Medical History:  Diagnosis Date  . Dementia   . Diabetes mellitus without complication (HCC)   . Diverticulitis   . Hypertension   . Liver cirrhosis (HCC)   . TIA (transient ischemic attack)     Past Surgical History:  Procedure Laterality Date  . FEMUR SURGERY Left     OB History    Gravida  6   Para  6   Term  5   Preterm  1   AB      Living  6     SAB      TAB      Ectopic      Multiple      Live Births              Allergies  Allergen Reactions  . Morphine And Related Anaphylaxis  . Donepezil Other (See Comments)    Made dementia worse    Social History   Socioeconomic History  .  Marital status: Married    Spouse name: Not on file  . Number of children: Not on file  . Years of education: Not on file  . Highest education level: Not on file  Occupational History  . Not on file  Social Needs  . Financial resource strain: Not on file  . Food insecurity:    Worry: Not on file    Inability: Not on file  . Transportation needs:    Medical: Not on file    Non-medical: Not on file  Tobacco Use  . Smoking status: Former Smoker    Years: 20.00    Types: Cigarettes    Last attempt to quit: 05/27/1979    Years since quitting: 38.6  . Smokeless tobacco: Never Used  Substance and Sexual Activity  . Alcohol use: No  . Drug use: No  . Sexual activity: Not Currently    Birth control/protection: Post-menopausal  Lifestyle  . Physical activity:    Days per week: Not on file    Minutes per session: Not on file  . Stress: Not on file  Relationships  . Social  connections:    Talks on phone: Not on file    Gets together: Not on file    Attends religious service: Not on file    Active member of club or organization: Not on file    Attends meetings of clubs or organizations: Not on file    Relationship status: Not on file  Other Topics Concern  . Not on file  Social History Narrative  . Not on file    Family History  Problem Relation Age of Onset  . Heart failure Mother   . Diabetes Other   . Heart attack Father   . Alcohol abuse Brother   . Stroke Brother     Medications:       Current Outpatient Medications:  .  carvedilol (COREG) 25 MG tablet, Take 25 mg by mouth 2 (two) times daily. , Disp: , Rfl:  .  clopidogrel (PLAVIX) 75 MG tablet, Take 75 mg by mouth daily., Disp: , Rfl:  .  diclofenac sodium (VOLTAREN) 1 % GEL, Apply 2 g topically 2 (two) times daily., Disp: , Rfl:  .  escitalopram (LEXAPRO) 10 MG tablet, Take 20 mg by mouth daily. , Disp: , Rfl:  .  insulin aspart (NOVOLOG) 100 UNIT/ML injection, Inject into the skin as directed., Disp: , Rfl:  .  levothyroxine (SYNTHROID, LEVOTHROID) 75 MCG tablet, Take 75 mcg by mouth daily., Disp: , Rfl:  .  LORazepam (ATIVAN) 0.5 MG tablet, Take 0.5 mg by mouth 2 (two) times daily. , Disp: , Rfl:  .  Menthol, Topical Analgesic, (BIOFREEZE) 4 % GEL, Apply 1 application topically 3 (three) times daily., Disp: , Rfl:  .  metFORMIN (GLUCOPHAGE) 500 MG tablet, Take 500 mg by mouth 2 (two) times daily., Disp: , Rfl:  .  metroNIDAZOLE (METROGEL) 0.75 % vaginal gel, Place 1 Applicatorful vaginally 2 (two) times daily., Disp: 70 g, Rfl: 1 .  mirabegron ER (MYRBETRIQ) 25 MG TB24 tablet, Take 25 mg by mouth daily., Disp: , Rfl:  .  nitroGLYCERIN (NITROSTAT) 0.4 MG SL tablet, Place 0.4 mg under the tongue every 5 (five) minutes as needed for chest pain., Disp: , Rfl:  .  oxyCODONE (OXY IR/ROXICODONE) 5 MG immediate release tablet, Take 5 mg by mouth every 4 (four) hours. , Disp: , Rfl:  .  traZODone  (DESYREL) 50 MG tablet, Take 75 mg by  mouth at bedtime. , Disp: , Rfl:  .  trolamine salicylate (ASPERCREME) 10 % cream, Apply 1 application topically 2 (two) times daily as needed for muscle pain., Disp: , Rfl:  .  Vitamin D, Ergocalciferol, (DRISDOL) 50000 units CAPS capsule, Take 50,000 Units by mouth every 7 (seven) days., Disp: , Rfl:   Objective Blood pressure 124/80, pulse 82, height 5\' 6"  (1.676 m), weight 220 lb (99.8 kg).    Pertinent ROS   Labs or studies     Impression Diagnoses this Encounter::   ICD-10-CM   1. Thickened endometrium R93.89   2. PMB (postmenopausal bleeding) N95.0     Established relevant diagnosis(es):   Plan/Recommendations: No orders of the defined types were placed in this encounter.   Labs or Scans Ordered: No orders of the defined types were placed in this encounter.   Management:: Unable to perform EMB due to anatomy and positioning  Follow up Return if symptoms worsen or fail to improve.       All questions were answered.

## 2019-02-24 DEATH — deceased
